# Patient Record
Sex: Male | Born: 1995 | Race: Black or African American | Hispanic: No | Marital: Single | Smoking: Never smoker
Health system: Southern US, Community
[De-identification: ages and names within clinical notes are randomized; demographics above are authoritative.]

## PROBLEM LIST (undated history)

## (undated) DIAGNOSIS — S83207A Unspecified tear of unspecified meniscus, current injury, left knee, initial encounter: Secondary | ICD-10-CM

## (undated) DIAGNOSIS — Z87820 Personal history of traumatic brain injury: Secondary | ICD-10-CM

---
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
## 2016-02-26 ENCOUNTER — Ambulatory Visit (HOSPITAL_COMMUNITY)
Admission: EM | Admit: 2016-02-26 | Discharge: 2016-02-26 | Disposition: A | Discharge status: Home / Self Care | Payer: Managed Care, Other (non HMO) | Attending: Internal Medicine | Admitting: Internal Medicine

## 2016-02-26 ENCOUNTER — Encounter (HOSPITAL_COMMUNITY): Payer: Self-pay | Admitting: *Deleted

## 2016-02-26 DIAGNOSIS — J019 Acute sinusitis, unspecified: Secondary | ICD-10-CM

## 2016-02-26 MED ORDER — TRIAMCINOLONE ACETONIDE 55 MCG/ACT NA AERO: 2.0000 | INHALATION_SPRAY | Freq: Every day | NASAL | 0 refills | Status: DC

## 2016-02-26 MED ORDER — AMOXICILLIN-POT CLAVULANATE 875-125 MG PO TABS: 1.0000 | ORAL_TABLET | Freq: Two times a day (BID) | ORAL | 0 refills | Status: AC

## 2016-02-26 MED ORDER — AMOXICILLIN-POT CLAVULANATE 875-125 MG PO TABS: 1.0000 | ORAL_TABLET | Freq: Two times a day (BID) | ORAL | 0 refills | Status: DC

## 2016-02-26 NOTE — ED Triage Notes (Signed)
Pt  Reports  Symptoms    Of  Body  Aches        wirth  Symptoms   X      10  Days  Pt     Ambulated  To  Room  With a  Steady  Fluid  Gait  Pt         Reports      Symptoms  Not  releived    By otc meds

## 2016-02-26 NOTE — Discharge Instructions (Addendum)
Rest and push fluids.  Prescription for amoxicillin/clavulanate (antibiotic) and nasacort (nasal steroid, for congestion) was sent to the [facility] on [address].  Note for practice today.  Recheck or followup with primary care provider Delorise Jacksonichard Boette for further evaluation if symptoms persist.

## 2016-02-26 NOTE — ED Provider Notes (Signed)
[facility]    CSN: [id number] Arrival date & time: 02/26/16  1134     History   Chief Complaint Chief Complaint  Patient presents with  . URI    HPI Christian Grant is a 20 y.o. male. Presents today with >10d hx of scratchy throat, prod cough, sneezing, severe sinus congestion and runny nose.  Malaise, fatigue.  Headache.  Tactile temp.  Not achey.  No vomiting, no diarrhea.  HPI  Past Medical History:  Diagnosis Date  . Acute meniscal tear of left knee   . History of concussion    2012-- PLAYING FOOTBALL    Patient Active Problem List   Diagnosis Date Noted  . S/P left knee arthroscopy 07/27/2013    Past Surgical History:  Procedure Laterality Date  . KNEE ARTHROSCOPY Left 07/27/2013   Procedure: LEFT ARTHROSCOPY KNEE WITH PARTIAL MENISECTOMY VS REPAIR;  Surgeon: Eugenia Mcalpine, MD;  Location: [facility] [place];  Service: Orthopedics;  Laterality: Left;       Home Medications    Prior to Admission medications   Medication Sig Start Date End Date Taking? Authorizing Provider  amoxicillin-clavulanate (AUGMENTIN) 875-125 MG tablet Take 1 tablet by mouth 2 (two) times daily. 02/26/16 03/07/16  Eustace Moore, MD  triamcinolone (NASACORT AQ) 55 MCG/ACT AERO nasal inhaler Place 2 sprays into the nose daily. 02/26/16   Eustace Moore, MD    Family History History reviewed. No pertinent family history.  Social History Social History  Substance Use Topics  . Smoking status: Never Smoker  . Smokeless tobacco: Not on file     Comment: NO SMOKER IN HOME  . Alcohol use No     Allergies   Vicodin [hydrocodone-acetaminophen] and Vicodin [hydrocodone-acetaminophen]   Review of Systems Review of Systems  All other systems reviewed and are negative.    Physical Exam Triage Vital Signs ED Triage Vitals [02/26/16 1212]  Enc Vitals Group     BP 115/58     Pulse Rate 78     Resp 18     Temp 98.6 F (37 C)     Temp Source Oral     SpO2 100 %       Weight      Height    Updated Vital Signs BP 115/58 (BP Location: Right Arm)   Pulse 78   Temp 98.6 F (37 C) (Oral)   Resp 18   SpO2 100%  Physical Exam  Constitutional: He is oriented to person, place, and time.  Alert, nicely groomed Looks ill but not toxic  HENT:  Head: Atraumatic.  B ears are waxy Marked nasal congestion with mucopurulent material present Throat red with post nasal drainage  Eyes:  Conjugate gaze, no eye redness/drainage  Neck: Neck supple.  Cardiovascular: Normal rate.   Pulmonary/Chest: No respiratory distress.  Lungs clear, symmetric breath sounds  Abdominal: He exhibits no distension.  Musculoskeletal: Normal range of motion.  Neurological: He is alert and oriented to person, place, and time.  Skin: Skin is warm and dry.  No cyanosis  Nursing note and vitals reviewed.    UC Treatments / Results   Procedures Procedures (including critical care time)      None today  Final Clinical Impressions(s) / UC Diagnoses   Final diagnoses:  Acute sinusitis with symptoms > 10 days   Rest and push fluids.  Prescription for amoxicillin/clavulanate (antibiotic) and nasacort (nasal steroid, for congestion) was sent to the [facility] on [address].  Note  for practice today.  Recheck or followup with primary care provider Delorise Jacksonichard Boette for further evaluation if symptoms persist.    New Prescriptions Current Discharge Medication List    START taking these medications   Details  amoxicillin-clavulanate (AUGMENTIN) 875-125 MG tablet Take 1 tablet by mouth 2 (two) times daily. Qty: 20 tablet, Refills: 0    triamcinolone (NASACORT AQ) 55 MCG/ACT AERO nasal inhaler Place 2 sprays into the nose daily. Qty: 1 Inhaler, Refills: 0         Eustace MooreLaura W Caeleb Batalla, MD 02/29/16 [phone]

## 2016-04-16 ENCOUNTER — Emergency Department (HOSPITAL_BASED_OUTPATIENT_CLINIC_OR_DEPARTMENT_OTHER)
Admission: EM | Admit: 2016-04-16 | Discharge: 2016-04-17 | Disposition: A | Discharge status: Home / Self Care | Payer: Managed Care, Other (non HMO) | Attending: Emergency Medicine | Admitting: Emergency Medicine

## 2016-04-16 ENCOUNTER — Encounter (HOSPITAL_BASED_OUTPATIENT_CLINIC_OR_DEPARTMENT_OTHER): Payer: Self-pay

## 2016-04-16 DIAGNOSIS — J029 Acute pharyngitis, unspecified: Secondary | ICD-10-CM | POA: Diagnosis not present

## 2016-04-16 DIAGNOSIS — R509 Fever, unspecified: Secondary | ICD-10-CM | POA: Diagnosis present

## 2016-04-16 LAB — CBC WITH DIFFERENTIAL/PLATELET
Basophils Absolute: 0 10*3/uL (ref 0.0–0.1)
Basophils Relative: 0 %
EOS ABS: 0 10*3/uL (ref 0.0–0.7)
EOS PCT: 0 %
HCT: 39.8 % (ref 39.0–52.0)
Hemoglobin: 13.5 g/dL (ref 13.0–17.0)
LYMPHS ABS: 1.1 10*3/uL (ref 0.7–4.0)
Lymphocytes Relative: 17 %
MCH: 28.7 pg (ref 26.0–34.0)
MCHC: 33.9 g/dL (ref 30.0–36.0)
MCV: 84.7 fL (ref 78.0–100.0)
MONO ABS: 1 10*3/uL (ref 0.1–1.0)
MONOS PCT: 16 %
Neutro Abs: 4.3 10*3/uL (ref 1.7–7.7)
Neutrophils Relative %: 67 %
Platelets: 229 10*3/uL (ref 150–400)
RBC: 4.7 MIL/uL (ref 4.22–5.81)
RDW: 12 % (ref 11.5–15.5)
WBC: 6.4 10*3/uL (ref 4.0–10.5)

## 2016-04-16 LAB — MONONUCLEOSIS SCREEN: Mono Screen: NEGATIVE

## 2016-04-16 LAB — RAPID STREP SCREEN (MED CTR MEBANE ONLY): STREPTOCOCCUS, GROUP A SCREEN (DIRECT): NEGATIVE

## 2016-04-16 MED ORDER — ACETAMINOPHEN 325 MG PO TABS
650.0000 mg | ORAL_TABLET | Freq: Once | ORAL | Status: AC
  Administered 2016-04-16: 650 mg via ORAL
  Filled 2016-04-16: qty 2

## 2016-04-16 MED ORDER — AMOXICILLIN 500 MG PO CAPS: 1000.0000 mg | ORAL_CAPSULE | Freq: Two times a day (BID) | ORAL | 0 refills | Status: AC

## 2016-04-16 MED ORDER — AMOXICILLIN 500 MG PO CAPS
500.0000 mg | ORAL_CAPSULE | Freq: Once | ORAL | Status: AC
  Administered 2016-04-16: 500 mg via ORAL
  Filled 2016-04-16: qty 1

## 2016-04-16 NOTE — ED Notes (Signed)
Pt verbalizes understanding of d/c instructions and denies any further needs at this time. 

## 2016-04-16 NOTE — ED Triage Notes (Signed)
Pt states he can take tylenol without allergy

## 2016-04-16 NOTE — Discharge Instructions (Signed)
You were seen in the ED today with sore throat and fever. We are treating you with antibiotics and will have you follow up with your PCP.   Return to the ED with any sudden worsening sore throat, difficulty speaking, drooling, or difficulty breathing.

## 2016-04-16 NOTE — ED Triage Notes (Signed)
C/o HA x 1 week-fever started yesterday-last dose motrin 400mg  just PTA-NAD-steady gait

## 2016-04-16 NOTE — ED Provider Notes (Signed)
Emergency Department Provider Note   I have reviewed the triage vital signs and the nursing notes.  By signing my name below, I, Christian Grant, attest that this documentation has been prepared under the direction and in the presence of Christian PlanJoshua G Long, MD. Electronically Signed: Bridgette HabermannMaria Grant, ED Scribe. 04/16/16. 9:48 PM.  HISTORY  Chief Complaint Fever  HPI Comments: Christian Grant is a 20 y.o. male with no pertinent PMHx, who presents to the Emergency Department complaining of headache onset one week ago with associated fever (Tmax 103), sore throat, and nasal congestion. He has taken Motrin 400 mg with moderate relief to his symptoms. Pt has h/o migraines and states his headache at this time feels similar. No known sick contacts with similar symptoms. Pt denies vomiting, leg swelling, or any other associated symptoms.   Past Medical History:  Diagnosis Date  . Acute meniscal tear of left knee   . History of concussion    2012-- PLAYING FOOTBALL    Patient Active Problem List   Diagnosis Date Noted  . S/P left knee arthroscopy 07/27/2013    Past Surgical History:  Procedure Laterality Date  . KNEE ARTHROSCOPY Left 07/27/2013   Procedure: LEFT ARTHROSCOPY KNEE WITH PARTIAL MENISECTOMY VS REPAIR;  Surgeon: Christian Mcalpineobert Collins, MD;  [facility] [place];  Service: Orthopedics;  Laterality: Left;      Allergies Vicodin [hydrocodone-acetaminophen] and Vicodin [hydrocodone-acetaminophen]  No family history on file.  Social History Social History  Substance Use Topics  . Smoking status: Never Smoker  . Smokeless tobacco: Never Used  . Alcohol use No    Review of Systems Constitutional: Fever. No chills. Eyes: No visual changes. ENT: Sore throat. Nasal congestion. Cardiovascular: Denies chest pain. Respiratory: Denies shortness of breath. Gastrointestinal: No abdominal pain.  No nausea, no vomiting.  No diarrhea.  No constipation. Genitourinary: Negative for  dysuria. Musculoskeletal: Negative for back pain. Skin: Negative for rash. Neurological: Headache. No focal weakness or numbness. 10-point ROS otherwise negative.  ____________________________________________   PHYSICAL EXAM:  VITAL SIGNS: ED Triage Vitals  Enc Vitals Group     BP 04/16/16 2100 132/76     Pulse Rate 04/16/16 2100 86     Resp 04/16/16 2100 18     Temp 04/16/16 2100 102.6 F (39.2 C)     Temp Source 04/16/16 2100 Oral     SpO2 04/16/16 2100 95 %     Weight 04/16/16 2101 178 lb (80.7 kg)     Height 04/16/16 2101 6\' 2"  (1.88 m)     Pain Score 04/16/16 2057 8   Constitutional: Alert and oriented. Well appearing and in no acute distress. Eyes: Conjunctivae are normal.  Head: Atraumatic. Nose: No congestion/rhinnorhea. Mouth/Throat: Mucous membranes are moist.  Bilateral tonsillar hypertrophy and tonsillar exudate. No PTA. Managing oral secretions. Positive cervical adenopathy.   Neck: No stridor.  No meningeal signs.   Cardiovascular: Normal rate, regular rhythm. Good peripheral circulation. Grossly normal heart sounds.   Respiratory: Normal respiratory effort.  No retractions. Lungs CTAB. Gastrointestinal: Soft and nontender. No distention.  Musculoskeletal: No lower extremity tenderness nor edema. No gross deformities of extremities. Neurologic:  Normal speech and language. No gross focal neurologic deficits are appreciated.  Skin:  Skin is warm, dry and intact. No rash noted.   ____________________________________________   LABS (all labs ordered are listed, but only abnormal results are displayed)  Labs Reviewed  RAPID STREP SCREEN (NOT [facility])  CULTURE, GROUP A STREP [facility])  MONONUCLEOSIS  SCREEN  CBC WITH DIFFERENTIAL/PLATELET   ____________________________________________   PROCEDURES  Procedure(s) performed:   Procedures  None ____________________________________________   INITIAL IMPRESSION / ASSESSMENT AND PLAN / ED  COURSE  Pertinent labs & imaging results that were available during my care of the patient were reviewed by me and considered in my medical decision making (see chart for details).  Patient presents to the ED with sore throat and fever. He has tonsillar hypertrophy and exudate with cervical adenopathy. No cough. High risk for strep by [facility] criteria despite negative rapid strep. Mono screen and CBC negative. 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 With negative mono screen he will take abx for likely strep and follow up with PCP and trainer regarding return to play.  At this time, I do not feel there is any life-threatening condition present. I have reviewed and discussed all results (EKG, imaging, lab, urine as appropriate), exam findings with patient. I have reviewed nursing notes and appropriate previous records.  I feel the patient is safe to be discharged home without further emergent workup. Discussed usual and customary return precautions. Patient and family (if present) verbalize understanding and are comfortable with this plan.  Patient will follow-up with their primary care provider. If they do not have a primary care provider, information for follow-up has been provided to them. All questions have been answered.  ____________________________________________  FINAL CLINICAL IMPRESSION(S) / ED DIAGNOSES  Final diagnoses:  Pharyngitis, unspecified etiology     MEDICATIONS GIVEN DURING THIS VISIT:  Medications  acetaminophen (TYLENOL) tablet 650 mg (650 mg Oral Given 04/16/16 2102)  amoxicillin (AMOXIL) capsule 500 mg (500 mg Oral Given 04/16/16 2350)     NEW OUTPATIENT MEDICATIONS STARTED DURING THIS VISIT:  Discharge Medication List as of 04/16/2016 11:39 PM    START taking these medications   Details  amoxicillin (AMOXIL) 500 MG capsule Take 2 capsules (1,000 mg total) by mouth 2 (two) times daily., Starting Tue 04/16/2016, Until Tue 04/23/2016, Print       I personally  performed the services described in this documentation, which was scribed in my presence. The recorded information has been reviewed and is accurate.   Note:  This document was prepared using Dragon voice recognition software and may include unintentional dictation errors.  Alona BeneJoshua Long, MD Emergency Medicine    Christian PlanJoshua G Long, MD 04/17/16 [phone]

## 2016-04-19 LAB — CULTURE, GROUP A STREP (THRC)

## 2016-09-13 ENCOUNTER — Emergency Department (HOSPITAL_BASED_OUTPATIENT_CLINIC_OR_DEPARTMENT_OTHER)
Admission: EM | Admit: 2016-09-13 | Discharge: 2016-09-13 | Disposition: A | Discharge status: Home / Self Care | Payer: 59 | Attending: Emergency Medicine | Admitting: Emergency Medicine

## 2016-09-13 ENCOUNTER — Encounter (HOSPITAL_BASED_OUTPATIENT_CLINIC_OR_DEPARTMENT_OTHER): Payer: Self-pay | Admitting: Emergency Medicine

## 2016-09-13 DIAGNOSIS — J029 Acute pharyngitis, unspecified: Secondary | ICD-10-CM

## 2016-09-13 DIAGNOSIS — J069 Acute upper respiratory infection, unspecified: Secondary | ICD-10-CM | POA: Diagnosis not present

## 2016-09-13 DIAGNOSIS — R509 Fever, unspecified: Secondary | ICD-10-CM | POA: Diagnosis present

## 2016-09-13 LAB — RAPID STREP SCREEN (MED CTR MEBANE ONLY): Streptococcus, Group A Screen (Direct): NEGATIVE

## 2016-09-13 MED ORDER — IBUPROFEN 400 MG PO TABS
400.0000 mg | ORAL_TABLET | Freq: Once | ORAL | Status: AC
  Administered 2016-09-13: 400 mg via ORAL
  Filled 2016-09-13: qty 1

## 2016-09-13 NOTE — ED Provider Notes (Signed)
[facility]   CSN: [id number] Arrival date & time: 09/13/16  1201     History   Chief Complaint Chief Complaint  Patient presents with  . Fever  . Sore Throat    HPI Christian Grant is a 21 y.o. male.  Patient c/o sore throat for the past 3 days. Symptoms persistent, constant, moderate. No trouble breathing or swallowing. +nasal congestion, +occasional non prod cough. No sore throat. +fever. No cp or sob. No known mono or strep exposure.    The history is provided by the patient.  Fever   Associated symptoms include sore throat. Pertinent negatives include no diarrhea, no vomiting and no headaches.  Sore Throat  Pertinent negatives include no headaches and no shortness of breath.    Past Medical History:  Diagnosis Date  . Acute meniscal tear of left knee   . History of concussion    2012-- PLAYING FOOTBALL    Patient Active Problem List   Diagnosis Date Noted  . S/P left knee arthroscopy 07/27/2013    Past Surgical History:  Procedure Laterality Date  . KNEE ARTHROSCOPY Left 07/27/2013   Procedure: LEFT ARTHROSCOPY KNEE WITH PARTIAL MENISECTOMY VS REPAIR;  Surgeon: Eugenia Mcalpine, MD;  Location: [facility] [place];  Service: Orthopedics;  Laterality: Left;       Home Medications    Prior to Admission medications   Not on File    Family History History reviewed. No pertinent family history.  Social History Social History  Substance Use Topics  . Smoking status: Never Smoker  . Smokeless tobacco: Never Used  . Alcohol use No     Allergies   Vicodin [hydrocodone-acetaminophen] and Vicodin [hydrocodone-acetaminophen]   Review of Systems Review of Systems  Constitutional: Positive for fever.  HENT: Positive for sore throat. Negative for trouble swallowing and voice change.   Respiratory: Negative for shortness of breath.   Gastrointestinal: Negative for diarrhea and vomiting.  Musculoskeletal: Negative for neck  pain and neck stiffness.  Skin: Negative for rash.  Neurological: Negative for headaches.     Physical Exam Updated Vital Signs BP 122/79   Pulse 74   Temp 99.5 F (37.5 C) (Oral)   Resp 18   Ht  (1.854 m)   Wt 81.6 kg   SpO2 98%   BMI 23.75 kg/m   Physical Exam  Constitutional: He appears well-developed and well-nourished. No distress.  HENT:  Pharynx erythematous, no asymmetric swelling or abscess. No trismus.   Eyes: Conjunctivae are normal.  Neck: Neck supple. No tracheal deviation present.  No stiffness or rigidity  Cardiovascular: Normal rate, regular rhythm, normal heart sounds and intact distal pulses.  Exam reveals no gallop and no friction rub.   No murmur heard. Pulmonary/Chest: Effort normal and breath sounds normal. No accessory muscle usage. No respiratory distress.  Abdominal: He exhibits no distension. There is no tenderness.  No hsm.   Musculoskeletal: He exhibits no edema.  Lymphadenopathy:    He has no cervical adenopathy.  Neurological: He is alert.  Skin: Skin is warm and dry. No rash noted. He is not diaphoretic.  Psychiatric: He has a normal mood and affect.  Nursing note and vitals reviewed.    ED Treatments / Results  Labs (all labs ordered are listed, but only abnormal results are displayed) Results for orders placed or performed during the hospital encounter of 09/13/16  Rapid strep screen  Result Value Ref Range   Streptococcus, Group A Screen (Direct)  NEGATIVE NEGATIVE    EKG  EKG Interpretation None       Radiology No results found.  Procedures Procedures (including critical care time)  Medications Ordered in ED Medications  ibuprofen (ADVIL,MOTRIN) tablet 400 mg (not administered)     Initial Impression / Assessment and Plan / ED Course  I have reviewed the triage vital signs and the nursing notes.  Pertinent labs & imaging results that were available during my care of the patient were reviewed by me and  considered in my medical decision making (see chart for details).  Strep test neg.  Patient denies taking any meds today.  Motrin po.  Po fluids.  Symptoms felt most c/w viral pharyngitis/uri.     Final Clinical Impressions(s) / ED Diagnoses   Final diagnoses:  None    New Prescriptions New Prescriptions   No medications on file     Cathren Laine, MD 09/13/16 1252

## 2016-09-13 NOTE — Discharge Instructions (Signed)
It was our pleasure to provide your ER care today - we hope that you feel better.  Your strep test is normal or negative.  Your symptoms are most likely the result of a viral cold or flu-like illness which should run its course and get better in the next 3-4 days.   Rest. Drink adequate fluids.  Take acetaminophen or ibuprofen as need for fever and pain.  Use throat lozenges as need.  Follow up with primary care doctor in 1 week if symptoms fail to improve/resolve.  Return to ER if worse, unable to swallow, trouble breathing, other concern.

## 2016-09-13 NOTE — ED Triage Notes (Signed)
Fever, sore throat and eye pain x 3 days

## 2016-09-15 LAB — CULTURE, GROUP A STREP (THRC)
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
## 2019-01-02 ENCOUNTER — Other Ambulatory Visit: Payer: Self-pay

## 2019-01-02 DIAGNOSIS — Z20822 Contact with and (suspected) exposure to covid-19: Secondary | ICD-10-CM

## 2019-01-03 LAB — NOVEL CORONAVIRUS, NAA: SARS-CoV-2, NAA: NOT DETECTED

## 2019-05-28 ENCOUNTER — Ambulatory Visit
Admission: EM | Admit: 2019-05-28 | Discharge: 2019-05-28 | Disposition: A | Discharge status: Home / Self Care | Payer: PRIVATE HEALTH INSURANCE | Attending: Physician Assistant | Admitting: Physician Assistant

## 2019-05-28 ENCOUNTER — Other Ambulatory Visit: Payer: Self-pay

## 2019-05-28 ENCOUNTER — Encounter: Payer: Self-pay | Admitting: Physician Assistant

## 2019-05-28 DIAGNOSIS — R300 Dysuria: Secondary | ICD-10-CM | POA: Diagnosis not present

## 2019-05-28 LAB — POCT URINALYSIS DIP (MANUAL ENTRY)
Bilirubin, UA: NEGATIVE
Blood, UA: NEGATIVE
Glucose, UA: NEGATIVE mg/dL
Ketones, POC UA: NEGATIVE mg/dL
Leukocytes, UA: NEGATIVE
Nitrite, UA: NEGATIVE
Protein Ur, POC: NEGATIVE mg/dL
Spec Grav, UA: >=1.03 — AB (ref 1.010–1.025)
Urobilinogen, UA: 0.2 E.U./dL
pH, UA: 6 (ref 5.0–8.0)

## 2019-05-28 NOTE — ED Provider Notes (Signed)
[facility]    CSN: [id number] Arrival date & time: 05/28/19  1843      History   Chief Complaint No chief complaint on file.   HPI Christian Grant is a 24 y.o. male.   24 year old male comes in for 1 to 2-week history of dysuria.  Had STD testing 4 days ago with negative results, including GC, trichomoniasis, HIV, RPR.  States has irritation/tingling sensation to the tip of the penis.  Denies urinary frequency, hematuria.  Denies abdominal pain, nausea, vomiting.  Denies fever, chills, body aches.  Denies testicular swelling, testicular pain, penile discharge, penile lesions.  Has not had sexual activity since STD testing.  Denies erythema/swelling to the tip of the penis.  Occasional alcohol use, no caffeine use.      Past Medical History:  Diagnosis Date  . Acute meniscal tear of left knee   . History of concussion    2012-- PLAYING FOOTBALL    Patient Active Problem List   Diagnosis Date Noted  . S/P left knee arthroscopy 07/27/2013    Past Surgical History:  Procedure Laterality Date  . KNEE ARTHROSCOPY Left 07/27/2013   Procedure: LEFT ARTHROSCOPY KNEE WITH PARTIAL MENISECTOMY VS REPAIR;  Surgeon: Sydnee Cabal, MD;  Location: [facility];  Service: Orthopedics;  Laterality: Left;       Home Medications    Prior to Admission medications   Not on File    Family History History reviewed. No pertinent family history.  Social History Social History   Tobacco Use  . Smoking status: Never Smoker  . Smokeless tobacco: Never Used  Substance Use Topics  . Alcohol use: No  . Drug use: No     Allergies   Vicodin [hydrocodone-acetaminophen] and Vicodin [hydrocodone-acetaminophen]   Review of Systems Review of Systems  Reason unable to perform ROS: See HPI as above.     Physical Exam Triage Vital Signs ED Triage Vitals [05/28/19 1855]  Enc Vitals Group     BP 130/76     Pulse Rate (!) 48     Resp 16     Temp 98.1 F (36.7  C)     Temp Source Oral     SpO2 97 %     Weight      Height      Head Circumference      Peak Flow      Pain Score      Pain Loc      Pain Edu?      Excl. in [place]?    No data found.  Updated Vital Signs BP 130/76 (BP Location: Left Arm)   Pulse (!) 48   Temp 98.1 F (36.7 C) (Oral)   Resp 16   SpO2 97%   Physical Exam Exam conducted with a chaperone present.  Constitutional:      General: He is not in acute distress.    Appearance: He is well-developed. He is not diaphoretic.  HENT:     Head: Normocephalic and atraumatic.  Eyes:     Conjunctiva/sclera: Conjunctivae normal.     Pupils: Pupils are equal, round, and reactive to light.  Pulmonary:     Effort: Pulmonary effort is normal. No respiratory distress.  Genitourinary:    Penis: Circumcised. No erythema, tenderness, discharge or swelling.      Testes: Normal.     Epididymis:     Right: Normal.     Left: Normal.  Skin:    General: Skin  is warm and dry.  Neurological:     Mental Status: He is alert and oriented to person, place, and time.      UC Treatments / Results  Labs (all labs ordered are listed, but only abnormal results are displayed) Labs Reviewed  POCT URINALYSIS DIP (MANUAL ENTRY) - Abnormal; Notable for the following components:      Result Value   Spec Grav, UA >=1.030 (*)    All other components within normal limits    EKG   Radiology No results found.  Procedures Procedures (including critical care time)  Medications Ordered in UC Medications - No data to display  Initial Impression / Assessment and Plan / UC Course  I have reviewed the triage vital signs and the nursing notes.  Pertinent labs & imaging results that were available during my care of the patient were reviewed by me and considered in my medical decision making (see chart for details).    Normal exam.  Urine shows increase in specific gravity, negative leukocytes/nitrites.  Negative blood, low suspicion for  urethral stone causing symptoms.  Discussed to push fluids at this time. Return precautions given. Otherwise, follow-up with urology if symptoms not improving.   Patient expresses understanding and agrees to plan.   Final Clinical Impressions(s) / UC Diagnoses   Final diagnoses:  Dysuria   ED Prescriptions    None     PDMP not reviewed this encounter.   Belinda Fisher, PA-C 05/28/19 1912

## 2019-05-28 NOTE — Discharge Instructions (Addendum)
Urine negative for infection. Does show you are dehydrated, which can cause some of your symptoms. Keep hydrated, urine should be clear to pale yellow in color. If developing testicular swelling/pain, penile lesion/sore, follow up for reevaluation. Otherwise follow up with urology if symptoms not improving.

## 2019-05-28 NOTE — ED Triage Notes (Signed)
Pt c/o urinary frequency and tingling for over a week and a half.

## 2019-06-04 ENCOUNTER — Encounter (HOSPITAL_BASED_OUTPATIENT_CLINIC_OR_DEPARTMENT_OTHER): Payer: Self-pay

## 2019-06-04 ENCOUNTER — Emergency Department (HOSPITAL_BASED_OUTPATIENT_CLINIC_OR_DEPARTMENT_OTHER)
Admission: EM | Admit: 2019-06-04 | Discharge: 2019-06-04 | Disposition: A | Discharge status: Home / Self Care | Payer: PRIVATE HEALTH INSURANCE | Attending: Emergency Medicine | Admitting: Emergency Medicine

## 2019-06-04 ENCOUNTER — Other Ambulatory Visit: Payer: Self-pay

## 2019-06-04 DIAGNOSIS — N489 Disorder of penis, unspecified: Secondary | ICD-10-CM | POA: Insufficient documentation

## 2019-06-04 DIAGNOSIS — Z885 Allergy status to narcotic agent status: Secondary | ICD-10-CM | POA: Insufficient documentation

## 2019-06-04 DIAGNOSIS — Z202 Contact with and (suspected) exposure to infections with a predominantly sexual mode of transmission: Secondary | ICD-10-CM | POA: Insufficient documentation

## 2019-06-04 DIAGNOSIS — Z7689 Persons encountering health services in other specified circumstances: Secondary | ICD-10-CM

## 2019-06-04 LAB — URINALYSIS, ROUTINE W REFLEX MICROSCOPIC
Bilirubin Urine: NEGATIVE
Glucose, UA: NEGATIVE mg/dL
Hgb urine dipstick: NEGATIVE
Ketones, ur: NEGATIVE mg/dL
Leukocytes,Ua: NEGATIVE
Nitrite: NEGATIVE
Protein, ur: NEGATIVE mg/dL
Specific Gravity, Urine: 1.025 (ref 1.005–1.030)
pH: 6 (ref 5.0–8.0)

## 2019-06-04 LAB — HIV ANTIBODY (ROUTINE TESTING W REFLEX): HIV Screen 4th Generation wRfx: NONREACTIVE

## 2019-06-04 MED ORDER — LIDOCAINE HCL (PF) 1 % IJ SOLN
INTRAMUSCULAR | Status: AC
  Administered 2019-06-04: 2 mL via INTRAMUSCULAR
  Filled 2019-06-04: qty 5

## 2019-06-04 MED ORDER — METRONIDAZOLE 500 MG PO TABS
2000.0000 mg | ORAL_TABLET | Freq: Once | ORAL | Status: AC
  Administered 2019-06-04: 2000 mg via ORAL
  Filled 2019-06-04: qty 4

## 2019-06-04 MED ORDER — DOXYCYCLINE HYCLATE 100 MG PO CAPS: 100.0000 mg | ORAL_CAPSULE | Freq: Two times a day (BID) | ORAL | 0 refills | Status: AC

## 2019-06-04 MED ORDER — CEFTRIAXONE SODIUM 500 MG IJ SOLR
500.0000 mg | Freq: Once | INTRAMUSCULAR | Status: AC
  Administered 2019-06-04: 500 mg via INTRAMUSCULAR
  Filled 2019-06-04: qty 500

## 2019-06-04 NOTE — Discharge Instructions (Addendum)
You were given a prescription for antibiotics. Please take the antibiotic prescription fully.   You have been tested for HIV, syphilis, chlamydia and gonorrhea.  These results will be available in approximately 3 days and you will be contacted by the hospital if the results are positive. Avoid sexual contact until you are aware of the results, and please inform all sexual partners if you test positive for any of these diseases.  Follow up with urology in 5-7 days. Please return to the emergency department for any new or worsening symptoms.

## 2019-06-04 NOTE — ED Provider Notes (Signed)
[facility] EMERGENCY DEPARTMENT Provider Note   CSN: [id number] Arrival date & time: 06/04/19  1140     History No chief complaint on file.   Christian Grant is a 24 y.o. male.  HPI   24 year old male presenting for evaluation due to concern for possible STD.  States that for the last 3 weeks he has had a tingling sensation to the tip of his penis.  He was tested for STDs about 3 weeks ago and results were negative.  He continued to have the abnormal sensation therefore followed up at urgent care last week for assessment for possible UTI however urinalysis was negative for this.  He has continued to have symptoms and now is also concerned because he thinks he has some redness at the meatus.  He denies any overt lesions, discharge, dysuria or urgency.  He states he was told he was dehydrated at urgent care so he has been hydrating and that therefore has had some urinary frequency.  Denies any fevers abdominal pain or GI symptoms.  He does report that he has had unprotected intercourse recently.  Past Medical History:  Diagnosis Date  . Acute meniscal tear of left knee   . History of concussion    2012-- PLAYING FOOTBALL    Patient Active Problem List   Diagnosis Date Noted  . S/P left knee arthroscopy 07/27/2013    Past Surgical History:  Procedure Laterality Date  . KNEE ARTHROSCOPY Left 07/27/2013   Procedure: LEFT ARTHROSCOPY KNEE WITH PARTIAL MENISECTOMY VS REPAIR;  Surgeon: Eugenia Mcalpine, MD;  Location: [facility];  Service: Orthopedics;  Laterality: Left;       No family history on file.  Social History   Tobacco Use  . Smoking status: Never Smoker  . Smokeless tobacco: Never Used  Substance Use Topics  . Alcohol use: Yes    Comment: occ  . Drug use: No    Home Medications Prior to Admission medications   Medication Sig Start Date End Date Taking? Authorizing Provider  doxycycline (VIBRAMYCIN) 100 MG capsule Take 1 capsule (100 mg  total) by mouth 2 (two) times daily for 7 days. 06/04/19 06/11/19  Deon Duer S, PA-C    Allergies    Vicodin [hydrocodone-acetaminophen]  Review of Systems   Review of Systems  Constitutional: Negative for fever.  Respiratory: Negative for shortness of breath.   Cardiovascular: Negative for chest pain.  Gastrointestinal: Negative for abdominal pain, constipation, diarrhea, nausea and vomiting.  Genitourinary: Positive for frequency. Negative for difficulty urinating, discharge, dysuria, genital sores, hematuria, penile pain, penile swelling, scrotal swelling, testicular pain and urgency.       Tingling sensation to penis, erythema to penis  Musculoskeletal: Negative for back pain.    Physical Exam Updated Vital Signs BP (!) 146/93 (BP Location: Left Arm)   Pulse 75   Temp 98.6 F (37 C) (Oral)   Resp 16   Ht 6\' 2"  (1.88 m)   Wt 82.1 kg   SpO2 98%   BMI 23.24 kg/m   Physical Exam Constitutional:      General: He is not in acute distress.    Appearance: He is well-developed.  Eyes:     Conjunctiva/sclera: Conjunctivae normal.  Cardiovascular:     Rate and Rhythm: Normal rate and regular rhythm.  Pulmonary:     Effort: Pulmonary effort is normal.     Breath sounds: Normal breath sounds.  Genitourinary:    Comments: Chaperone presents. uncircumcised penis. No  erythema, lesions, or abnormalities noted to the skin, specifically the meatus appears wnl. No discharge noted. Skin:    General: Skin is warm and dry.  Neurological:     Mental Status: He is alert and oriented to person, place, and time.     ED Results / Procedures / Treatments   Labs (all labs ordered are listed, but only abnormal results are displayed) Labs Reviewed  URINALYSIS, ROUTINE W REFLEX MICROSCOPIC  HIV ANTIBODY (ROUTINE TESTING W REFLEX)  RPR  GC/CHLAMYDIA PROBE AMP ([place]) NOT AT [facility]    EKG None  Radiology No results found.  Procedures Procedures (including critical care  time)  Medications Ordered in ED Medications  cefTRIAXone (ROCEPHIN) injection 500 mg (has no administration in time range)  metroNIDAZOLE (FLAGYL) tablet 2,000 mg (has no administration in time range)    ED Course  I have reviewed the triage vital signs and the nursing notes.  Pertinent labs & imaging results that were available during my care of the patient were reviewed by me and considered in my medical decision making (see chart for details).    MDM Rules/Calculators/A&P                      24 year old male presenting for evaluation due to concern for possible STD.  States that for the last 3 weeks he has had a tingling sensation to the tip of his penis.  He was tested for STDs about 3 weeks ago and results were negative.  He continued to have the abnormal sensation therefore followed up at urgent care last week for assessment for possible UTI however urinalysis was negative for this.  He has continued to have symptoms and now is also concerned because he thinks he has some redness at the meatus.  He denies any overt lesions, discharge, dysuria or urgency.  He states he was told he was dehydrated at urgent care so he has been hydrating and that therefore has had some urinary frequency.  Denies any fevers abdominal pain or GI symptoms.  He does report that he has had unprotected intercourse recently.  GU examination is within normal limits.  No lesions to suggest herpes.  Urinalysis does not show evidence of UTI.  GC/Chlamydia testing was sent and pending at the time of discharge.  Given patient symptoms have persisted will empirically treat for STDs.  HIV/RPR testing obtained as well.  Will give follow-up with [facility].  Advised on specific return precautions.  He voiced understanding is agreement plan. Pt stable for discharge.  Final Clinical Impression(s) / ED Diagnoses Final diagnoses:  Disorder of penis, unspecified  Encounter for assessment of STD exposure    Rx / DC  Orders ED Discharge Orders         Ordered    doxycycline (VIBRAMYCIN) 100 MG capsule  2 times daily     06/04/19 [id number], [place], PA-C 06/04/19 1243    Virgel Manifold, MD 06/04/19 1247

## 2019-06-04 NOTE — ED Triage Notes (Signed)
Pt requesting STD check-states he was seen for STD check 3 weeks ago and UTI 1 week-no abx given-pt denies penile d/c or dysuria-states he does have redness to penis x 1 wee-NAD-steady gait

## 2019-06-05 LAB — RPR: RPR Ser Ql: NONREACTIVE

## 2019-06-07 LAB — GC/CHLAMYDIA PROBE AMP (~~LOC~~) NOT AT ARMC
Chlamydia: NEGATIVE
Neisseria Gonorrhea: NEGATIVE

## 2019-09-03 ENCOUNTER — Encounter (HOSPITAL_COMMUNITY): Payer: Self-pay | Admitting: Emergency Medicine

## 2019-09-03 ENCOUNTER — Other Ambulatory Visit: Payer: Self-pay

## 2019-09-03 ENCOUNTER — Emergency Department (HOSPITAL_COMMUNITY)
Admission: EM | Admit: 2019-09-03 | Discharge: 2019-09-03 | Disposition: A | Discharge status: Home / Self Care | Payer: PRIVATE HEALTH INSURANCE | Attending: Emergency Medicine | Admitting: Emergency Medicine

## 2019-09-03 DIAGNOSIS — U071 COVID-19: Secondary | ICD-10-CM | POA: Insufficient documentation

## 2019-09-03 DIAGNOSIS — R42 Dizziness and giddiness: Secondary | ICD-10-CM

## 2019-09-03 LAB — CBG MONITORING, ED: Glucose-Capillary: 79 mg/dL (ref 70–99)

## 2019-09-03 NOTE — ED Provider Notes (Signed)
[facility]-EMERGENCY DEPT Provider Note   CSN: [id number] Arrival date & time: 09/03/19  1623     History Chief Complaint  Patient presents with  . covid pos    Christian Grant is a 24 y.o. male.  HPI    24 year old male presenting for evaluation of an episode of lightheadedness.  States that he got up from his bed and walk to the bathroom.  He when he came back to his room after going to the bathroom he felt lightheaded.  He did not actually have a syncopal episode.  He denies residual his symptoms.  He has had a headache since being diagnosed with Covid earlier this week. He has had fevers, cough, body aches.  He denies any chest pain or shortness of breath.  At this time his lightheadedness has resolved.  Past Medical History:  Diagnosis Date  . Acute meniscal tear of left knee   . History of concussion    2012-- PLAYING FOOTBALL    Patient Active Problem List   Diagnosis Date Noted  . S/P left knee arthroscopy 07/27/2013    Past Surgical History:  Procedure Laterality Date  . KNEE ARTHROSCOPY Left 07/27/2013   Procedure: LEFT ARTHROSCOPY KNEE WITH PARTIAL MENISECTOMY VS REPAIR;  Surgeon: Eugenia Mcalpine, MD;  Location: [facility];  Service: Orthopedics;  Laterality: Left;       No family history on file.  Social History   Tobacco Use  . Smoking status: Never Smoker  . Smokeless tobacco: Never Used  Substance Use Topics  . Alcohol use: Yes    Comment: occ  . Drug use: No    Home Medications Prior to Admission medications   Not on File    Allergies    Vicodin [hydrocodone-acetaminophen]  Review of Systems   Review of Systems  Constitutional: Positive for fever.  Respiratory: Positive for cough. Negative for shortness of breath.   Cardiovascular: Negative for chest pain.  Gastrointestinal: Negative for abdominal pain and diarrhea.  Genitourinary: Negative for dysuria.  Musculoskeletal: Positive for myalgias.    Neurological: Positive for light-headedness (resolved) and headaches.    Physical Exam Updated Vital Signs BP (!) 126/93   Pulse (!) 59   Temp 99.2 F (37.3 C) (Oral)   Resp 16   SpO2 99%   Physical Exam Constitutional:      General: He is not in acute distress.    Appearance: He is well-developed.  Eyes:     Conjunctiva/sclera: Conjunctivae normal.  Cardiovascular:     Rate and Rhythm: Normal rate and regular rhythm.  Pulmonary:     Effort: Pulmonary effort is normal.     Breath sounds: Normal breath sounds.  Abdominal:     Palpations: Abdomen is soft.     Tenderness: There is no abdominal tenderness.  Skin:    General: Skin is warm and dry.  Neurological:     Mental Status: He is alert and oriented to person, place, and time.     Comments: Mental Status:  Alert, thought content appropriate, able to give a coherent history. Speech fluent without evidence of aphasia. Able to follow 2 step commands without difficulty.  Cranial Nerves:  II: pupils equal, round, reactive to light III,IV, VI: ptosis not present, extra-ocular motions intact bilaterally  V,VII: smile symmetric, facial light touch sensation equal VIII: hearing grossly normal to voice  X: uvula elevates symmetrically  XI: bilateral shoulder shrug symmetric and strong XII: midline tongue extension without fassiculations Motor:  Normal tone. 5/5 strength of BUE and BLE major muscle groups including strong and equal grip strength and dorsiflexion/plantar flexion Sensory: light touch normal in all extremities. Gait: normal gait and balance.       ED Results / Procedures / Treatments   Labs (all labs ordered are listed, but only abnormal results are displayed) Labs Reviewed  CBG MONITORING, ED    EKG None  Radiology No results found.  Procedures Procedures (including critical care time)  Medications Ordered in ED Medications - No data to display  ED Course  I have reviewed the triage vital  signs and the nursing notes.  Pertinent labs & imaging results that were available during my care of the patient were reviewed by me and considered in my medical decision making (see chart for details).    MDM Rules/Calculators/A&P                     24 year old male presenting for evaluation of an episode of lightheadedness.  States that he got up from his bed and walk to the bathroom.  He when he came back to his room after going to the bathroom he felt lightheaded.  He did not actually have a syncopal episode.  He denies residual his symptoms.  He has had a headache since being diagnosed with Covid earlier this week. He has had fevers, cough, body aches.  He denies any chest pain or shortness of breath.  At this time his lightheadedness has resolved.  Patient's orthostatics are negative.  His CBG is within normal limits.  His EKG was personally reviewed by myself and Dr. Rogene Houston, supervising physician, Which showed normal sinus rhythm, heart rate 51. Also showed w possible ST elevations but this is not diffuse and I have very low suspicion for pericarditis.  I suspect this may be secondary to early repole.  He does not have any chest pain or shortness of breath to suggest pericarditis.  I discussed findings and plan with patient.  Advised to use Tylenol and Motrin for fevers and body aches.  Advised to stay well-hydrated.  He voices understanding of the plan and reasons to return.  All questions answered.  Patient able for discharge.   Christian Grant was evaluated in Emergency Department on 09/03/2019 for the symptoms described in the history of present illness. He was evaluated in the context of the global COVID-19 pandemic, which necessitated consideration that the patient might be at risk for infection with the SARS-CoV-2 virus that causes COVID-19. Institutional protocols and algorithms that pertain to the evaluation of patients at risk for COVID-19 are in a state of rapid change based on  information released by regulatory bodies including the CDC and federal and state organizations. These policies and algorithms were followed during the patient's care in the ED.   Final Clinical Impression(s) / ED Diagnoses Final diagnoses:  COVID-19  Episodic lightheadedness    Rx / DC Orders ED Discharge Orders    None       Bishop Dublin 09/03/19 1936    Fredia Sorrow, MD 09/13/19 [phone]

## 2019-09-03 NOTE — ED Notes (Signed)
Ginger Ale provided for pt as a fluid challenge

## 2019-09-03 NOTE — ED Triage Notes (Signed)
Per pt, states he was diagnosed with Covid on Tuesday-states he sat up in bed this am and felt a little light headed-symptoms resolved

## 2019-09-03 NOTE — Discharge Instructions (Signed)
Stay well hydrated. Rotate tylenol and ibuprofen for body ache and fevers.  Please follow up with your primary care provider within 5-7 days for re-evaluation of your symptoms. If you do not have a primary care provider, information for a healthcare clinic has been provided for you to make arrangements for follow up care. Please return to the emergency department for any new or worsening symptoms.

## 2019-09-21 ENCOUNTER — Other Ambulatory Visit: Payer: Self-pay | Admitting: *Deleted

## 2019-09-21 DIAGNOSIS — U071 COVID-19: Secondary | ICD-10-CM

## 2019-09-24 ENCOUNTER — Other Ambulatory Visit: Payer: Self-pay | Admitting: *Deleted

## 2019-09-24 ENCOUNTER — Ambulatory Visit (HOSPITAL_COMMUNITY): Payer: PRIVATE HEALTH INSURANCE | Attending: Cardiology

## 2019-09-24 ENCOUNTER — Other Ambulatory Visit: Payer: Self-pay

## 2019-09-24 DIAGNOSIS — U071 COVID-19: Secondary | ICD-10-CM | POA: Insufficient documentation

## 2019-09-24 LAB — TROPONIN I (HIGH SENSITIVITY): Troponin I (High Sensitivity): 4 ng/L (ref <18)

## 2020-02-28 DIAGNOSIS — M25562 Pain in left knee: Secondary | ICD-10-CM | POA: Insufficient documentation

## 2020-02-28 DIAGNOSIS — M949 Disorder of cartilage, unspecified: Secondary | ICD-10-CM | POA: Insufficient documentation

## 2022-03-19 ENCOUNTER — Emergency Department (HOSPITAL_BASED_OUTPATIENT_CLINIC_OR_DEPARTMENT_OTHER)
Admission: EM | Admit: 2022-03-19 | Discharge: 2022-03-19 | Disposition: A | Discharge status: Home / Self Care | Payer: BC Managed Care – PPO | Attending: Emergency Medicine | Admitting: Emergency Medicine

## 2022-03-19 ENCOUNTER — Other Ambulatory Visit: Payer: Self-pay

## 2022-03-19 ENCOUNTER — Emergency Department (HOSPITAL_BASED_OUTPATIENT_CLINIC_OR_DEPARTMENT_OTHER): Payer: BC Managed Care – PPO

## 2022-03-19 ENCOUNTER — Encounter (HOSPITAL_BASED_OUTPATIENT_CLINIC_OR_DEPARTMENT_OTHER): Payer: Self-pay | Admitting: Emergency Medicine

## 2022-03-19 DIAGNOSIS — R519 Headache, unspecified: Secondary | ICD-10-CM | POA: Insufficient documentation

## 2022-03-19 DIAGNOSIS — R202 Paresthesia of skin: Secondary | ICD-10-CM | POA: Diagnosis not present

## 2022-03-19 MED ORDER — METOCLOPRAMIDE HCL 10 MG PO TABS
10.0000 mg | ORAL_TABLET | Freq: Once | ORAL | Status: AC
  Administered 2022-03-19: 10 mg via ORAL
  Filled 2022-03-19: qty 1

## 2022-03-19 MED ORDER — METOCLOPRAMIDE HCL 10 MG PO TABS: 10.0000 mg | ORAL_TABLET | Freq: Four times a day (QID) | ORAL | 0 refills | Status: AC

## 2022-03-19 NOTE — ED Provider Notes (Signed)
[facility] EMERGENCY DEPARTMENT Provider Note   CSN: [id number] Arrival date & time: 03/19/22  1208     History  Chief Complaint  Patient presents with   Headache    Christian Grant is a 26 y.o. male.   Headache Patient is a 26 year old male who denies any pertinent past medical history denies any recreational drug or any alcohol use  He is presented emergency room today with complaints of headache for the past 3 days most of the day but states that it is significantly worse in the morning.  He had an episode yesterday where he had some numbness/tingling in his right face and felt like he had to smile very hard in order to smile.  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  He states he has never had a CT scan of his head however.  He also tells me that he has had headaches since he was a kid however he feels that they have significantly worsened and become more frequent over the past couple years.  He denies any numbness weakness slurred speech confusion chest pain difficulty breathing lightheadedness or dizziness.  Denies any recent head injuries.  He states that he took some ibuprofen last night which improved his headache.  He states currently his headache is 3/10 and bitemporal.  No vision changes blurry vision double vision.    Home Medications Prior to Admission medications   Medication Sig Start Date End Date Taking? Authorizing Provider  metoCLOPramide (REGLAN) 10 MG tablet Take 1 tablet (10 mg total) by mouth every 6 (six) hours. 03/19/22  Yes Gailen Shelter, PA      Allergies    Vicodin [hydrocodone-acetaminophen]    Review of Systems   Review of Systems  Neurological:  Positive for headaches.    Physical Exam Updated Vital Signs BP 132/88   Pulse 67   Temp 98.7 F (37.1 C) (Oral)   Resp 16   Ht 6\' 2"  (1.88 m)   Wt 81.6 kg   SpO2 96%   BMI 23.11 kg/m  Physical  Exam Vitals and nursing note reviewed.  Constitutional:      General: He is not in acute distress. HENT:     Head: Normocephalic and atraumatic.     Nose: Nose normal.  Eyes:     General: No scleral icterus. Cardiovascular:     Rate and Rhythm: Normal rate and regular rhythm.     Pulses: Normal pulses.     Heart sounds: Normal heart sounds.  Pulmonary:     Effort: Pulmonary effort is normal. No respiratory distress.     Breath sounds: No wheezing.  Abdominal:     Palpations: Abdomen is soft.     Tenderness: There is no abdominal tenderness.  Musculoskeletal:     Cervical back: Normal range of motion.     Right lower leg: No edema.     Left lower leg: No edema.  Skin:    General: Skin is warm and dry.     Capillary Refill: Capillary refill takes less than 2 seconds.  Neurological:     Mental Status: He is alert. Mental status is at baseline.     Comments: Alert and oriented to self, place, time and event.   Speech is fluent, clear without dysarthria or dysphasia.   Strength 5/5 in upper/lower extremities   Sensation intact in upper/lower extremities   Normal gait.  Negative Romberg. No pronator drift.  Normal finger-to-nose and feet tapping.  CN I not tested  CN II grossly intact visual fields bilaterally. Did not visualize posterior eye.  CN III, IV, VI PERRLA and EOMs intact bilaterally  CN V Intact sensation to sharp and light touch to the face  CN VII facial movements symmetric  CN VIII not tested  CN IX, X no uvula deviation, symmetric rise of soft palate  CN XI 5/5 SCM and trapezius strength bilaterally  CN XII Midline tongue protrusion, symmetric L/R movements   Psychiatric:        Mood and Affect: Mood normal.        Behavior: Behavior normal.     ED Results / Procedures / Treatments   Labs (all labs ordered are listed, but only abnormal results are displayed) Labs Reviewed - No data to display  EKG None  Radiology No results  found.  Procedures Procedures    Medications Ordered in ED Medications  metoCLOPramide (REGLAN) tablet 10 mg (10 mg Oral Given 03/19/22 1317)    ED Course/ Medical Decision Making/ A&P                           Medical Decision Making Amount and/or Complexity of Data Reviewed Radiology: ordered.  Risk Prescription drug management.   This patient presents to the ED for concern of progressively worsening headaches, this involves a number of treatment options, and is a complaint that carries with it a moderate to high risk of complications and morbidity. A differential diagnosis was considered for the patient's symptoms which is discussed below:   Intra-cranial neoplasm less likely but given positional changes and headache and increased frequency will obtain CT head. Could be stress related, dehydration although he does seem that he drinks water pretty regularly.   Co morbidities: Discussed in HPI   Brief History:  Patient is a 26 year old male who denies any pertinent past medical history denies any recreational drug or any alcohol use  He is presented emergency room today with complaints of headache for the past 3 days most of the day but states that it is significantly worse in the morning.  He had an episode yesterday where he had some numbness/tingling in his right face and felt like he had to smile very hard in order to smile.  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  He states he has never had a CT scan of his head however.  He also tells me that he has had headaches since he was a kid however he feels that they have significantly worsened and become more frequent over the past couple years.  He denies any numbness weakness slurred speech confusion chest pain difficulty breathing lightheadedness or dizziness.  Denies any recent head injuries.  He states that he took some ibuprofen last night  which improved his headache.  He states currently his headache is 3/10 and bitemporal.  No vision changes blurry vision double vision.    EMR reviewed including pt PMHx, past surgical history and past visits to ER.   See HPI for more details   Lab Tests:      Imaging Studies:  NAD. I personally reviewed all imaging studies and no acute abnormality found. I agree with radiology interpretation.  CT head - NML  Cardiac Monitoring:  NA NA   Medicines ordered:  I ordered  medication including Reglan for headache and nausea Reevaluation of the patient after these medicines showed that the patient resolved I have reviewed the patients home medicines and have made adjustments as needed   Critical Interventions:     Consults/Attending Physician      Reevaluation:  After the interventions noted above I re-evaluated patient and found that they have : Had resolution of the symptoms   Social Determinants of Health:      Problem List / ED Course:  Headache now resolved.  CT head unremarkable.  Will recommend follow-up with headache specialist given that he has had headaches for years.   Dispostion:  After consideration of the diagnostic results and the patients response to treatment, I feel that the patent would benefit from follow-up with headache specialist.  Given information for Dr. Tomi Likens. Reglan for headache/nausea PRN.     Final Clinical Impression(s) / ED Diagnoses Final diagnoses:  Acute nonintractable headache, unspecified headache type    Rx / DC Orders ED Discharge Orders          Ordered    metoCLOPramide (REGLAN) 10 MG tablet  Every 6 hours        03/19/22 1317              Pati Gallo [place], [place] 03/19/22 1429    Blanchie Dessert, MD 03/19/22 1442

## 2022-03-19 NOTE — Discharge Instructions (Signed)
Your CT scan is without any abnormal findings.  Please follow-up with the headache specialist.  Use reglan for headaches up to once every 6 hours but try to use only once per day.   Hydrate, avoid caffeine.

## 2022-03-19 NOTE — ED Triage Notes (Signed)
Recurrent headache x 3 days , tingling to whole body , facial numbness 2 days ago . Alert and oriented x 4 ambulatory to triage .  Vomited once last night , no light sensitivity . No Hx migraine . Denies URI symptoms

## 2023-01-22 ENCOUNTER — Ambulatory Visit: Payer: Self-pay | Admitting: Family Medicine

## 2023-01-22 ENCOUNTER — Encounter: Payer: Self-pay | Admitting: Family Medicine

## 2023-01-22 ENCOUNTER — Telehealth: Payer: Self-pay | Admitting: Family Medicine

## 2023-01-22 NOTE — Telephone Encounter (Signed)
Call patient to reschedule missed appointment. °

## 2023-06-11 ENCOUNTER — Emergency Department (HOSPITAL_BASED_OUTPATIENT_CLINIC_OR_DEPARTMENT_OTHER): Payer: Self-pay

## 2023-06-11 ENCOUNTER — Encounter (HOSPITAL_BASED_OUTPATIENT_CLINIC_OR_DEPARTMENT_OTHER): Payer: Self-pay | Admitting: Emergency Medicine

## 2023-06-11 ENCOUNTER — Other Ambulatory Visit: Payer: Self-pay

## 2023-06-11 ENCOUNTER — Emergency Department (HOSPITAL_BASED_OUTPATIENT_CLINIC_OR_DEPARTMENT_OTHER)
Admission: EM | Admit: 2023-06-11 | Discharge: 2023-06-11 | Disposition: A | Discharge status: Home / Self Care | Payer: Self-pay | Attending: Emergency Medicine | Admitting: Emergency Medicine

## 2023-06-11 ENCOUNTER — Ambulatory Visit
Admission: EM | Admit: 2023-06-11 | Discharge: 2023-06-11 | Disposition: A | Discharge status: Home / Self Care | Payer: Self-pay

## 2023-06-11 DIAGNOSIS — R109 Unspecified abdominal pain: Secondary | ICD-10-CM

## 2023-06-11 DIAGNOSIS — N132 Hydronephrosis with renal and ureteral calculous obstruction: Secondary | ICD-10-CM | POA: Insufficient documentation

## 2023-06-11 DIAGNOSIS — N201 Calculus of ureter: Secondary | ICD-10-CM

## 2023-06-11 LAB — CBC WITH DIFFERENTIAL/PLATELET
Abs Immature Granulocytes: 0.01 10*3/uL (ref 0.00–0.07)
Basophils Absolute: 0 10*3/uL (ref 0.0–0.1)
Basophils Relative: 1 %
Eosinophils Absolute: 0 10*3/uL (ref 0.0–0.5)
Eosinophils Relative: 1 %
HCT: 41.9 % (ref 39.0–52.0)
Hemoglobin: 14.5 g/dL (ref 13.0–17.0)
Immature Granulocytes: 0 %
Lymphocytes Relative: 34 %
Lymphs Abs: 1.4 10*3/uL (ref 0.7–4.0)
MCH: 29.4 pg (ref 26.0–34.0)
MCHC: 34.6 g/dL (ref 30.0–36.0)
MCV: 85 fL (ref 80.0–100.0)
Monocytes Absolute: 0.4 10*3/uL (ref 0.1–1.0)
Monocytes Relative: 10 %
Neutro Abs: 2.3 10*3/uL (ref 1.7–7.7)
Neutrophils Relative %: 54 %
Platelets: 296 10*3/uL (ref 150–400)
RBC: 4.93 MIL/uL (ref 4.22–5.81)
RDW: 12.2 % (ref 11.5–15.5)
WBC: 4.3 10*3/uL (ref 4.0–10.5)
nRBC: 0 % (ref 0.0–0.2)

## 2023-06-11 LAB — URINALYSIS, ROUTINE W REFLEX MICROSCOPIC
Bacteria, UA: NONE SEEN
Bilirubin Urine: NEGATIVE
Glucose, UA: NEGATIVE mg/dL
Ketones, ur: NEGATIVE mg/dL
Leukocytes,Ua: NEGATIVE
Nitrite: NEGATIVE
Specific Gravity, Urine: 1.023 (ref 1.005–1.030)
pH: 8.5 — ABNORMAL HIGH (ref 5.0–8.0)

## 2023-06-11 LAB — COMPREHENSIVE METABOLIC PANEL
ALT: 10 U/L (ref 0–44)
AST: 13 U/L — ABNORMAL LOW (ref 15–41)
Albumin: 5 g/dL (ref 3.5–5.0)
Alkaline Phosphatase: 42 U/L (ref 38–126)
Anion gap: 10 (ref 5–15)
BUN: 19 mg/dL (ref 6–20)
CO2: 28 mmol/L (ref 22–32)
Calcium: 9.6 mg/dL (ref 8.9–10.3)
Chloride: 103 mmol/L (ref 98–111)
Creatinine, Ser: 1.56 mg/dL — ABNORMAL HIGH (ref 0.61–1.24)
GFR, Estimated: >60 mL/min (ref >60)
Glucose, Bld: 97 mg/dL (ref 70–99)
Potassium: 3.5 mmol/L (ref 3.5–5.1)
Sodium: 141 mmol/L (ref 135–145)
Total Bilirubin: 1.3 mg/dL — ABNORMAL HIGH (ref 0.0–1.2)
Total Protein: 7.4 g/dL (ref 6.5–8.1)

## 2023-06-11 LAB — LIPASE, BLOOD: Lipase: 17 U/L (ref 11–51)

## 2023-06-11 MED ORDER — ONDANSETRON HCL 4 MG/2ML IJ SOLN
4.0000 mg | Freq: Once | INTRAMUSCULAR | Status: AC
  Administered 2023-06-11: 4 mg via INTRAVENOUS
  Filled 2023-06-11: qty 2

## 2023-06-11 MED ORDER — SODIUM CHLORIDE 0.9 % IV BOLUS
1000.0000 mL | Freq: Once | INTRAVENOUS | Status: AC
  Administered 2023-06-11: 1000 mL via INTRAVENOUS

## 2023-06-11 MED ORDER — MORPHINE SULFATE (PF) 4 MG/ML IV SOLN
4.0000 mg | Freq: Once | INTRAVENOUS | Status: AC
  Administered 2023-06-11: 4 mg via INTRAVENOUS
  Filled 2023-06-11: qty 1

## 2023-06-11 MED ORDER — OXYCODONE-ACETAMINOPHEN 5-325 MG PO TABS: 1.0000 | ORAL_TABLET | Freq: Four times a day (QID) | ORAL | 0 refills | Status: AC | PRN

## 2023-06-11 MED ORDER — LORAZEPAM 1 MG PO TABS
1.0000 mg | ORAL_TABLET | Freq: Once | ORAL | Status: AC
  Administered 2023-06-11: 1 mg via ORAL
  Filled 2023-06-11: qty 1

## 2023-06-11 MED ORDER — IOHEXOL 300 MG/ML  SOLN
100.0000 mL | Freq: Once | INTRAMUSCULAR | Status: AC | PRN
  Administered 2023-06-11: 80 mL via INTRAVENOUS

## 2023-06-11 MED ORDER — ONDANSETRON 4 MG PO TBDP: 4.0000 mg | ORAL_TABLET | Freq: Three times a day (TID) | ORAL | 0 refills | Status: AC | PRN

## 2023-06-11 MED ORDER — OXYCODONE-ACETAMINOPHEN 5-325 MG PO TABS: 1.0000 | ORAL_TABLET | Freq: Once | ORAL | Status: DC

## 2023-06-11 MED ORDER — KETOROLAC TROMETHAMINE 15 MG/ML IJ SOLN
15.0000 mg | Freq: Once | INTRAMUSCULAR | Status: AC
  Administered 2023-06-11: 15 mg via INTRAVENOUS
  Filled 2023-06-11: qty 1

## 2023-06-11 MED ORDER — CYCLOBENZAPRINE HCL 5 MG PO TABS: 5.0000 mg | ORAL_TABLET | Freq: Three times a day (TID) | ORAL | 0 refills | Status: AC | PRN

## 2023-06-11 MED ORDER — HYDROMORPHONE HCL 1 MG/ML IJ SOLN
1.0000 mg | Freq: Once | INTRAMUSCULAR | Status: AC
  Administered 2023-06-11: 1 mg via INTRAVENOUS
  Filled 2023-06-11: qty 1

## 2023-06-11 NOTE — Discharge Instructions (Addendum)
Please follow-up with urology, your stone will likely pass, in the next few days.  I have sent some pain medications, to help with the pain.  I have also sent some muscle relaxers, to help with the spasms.  Please strain your urine, as instructed.

## 2023-06-11 NOTE — ED Triage Notes (Signed)
Pt referred by UC, c/o acute LLQ pain with n/v today

## 2023-06-11 NOTE — ED Provider Notes (Addendum)
Patient presented to urgent care with a complaint of severe left-sided abdominal pain with 1 episode of nausea.  He reported symptoms started abruptly around 1300 while he was at work at [employer].  Patient came directly here for evaluation.  Patient describes pain as 10 out of 10.  This Clinical research associate evaluated him in triage and advised that I could call EMS or he could call someone to pick him up.  Patient attempted to reach out to his mom or friend to pick him up wherever he reported that he did not want EMS called he felt that pain was tolerable enough to drive himself to the emergency department.  Given complaint was abdominal pain nothing was given here for pain as patient advised that he would likely need further diagnostic workup.  Patient asked that he would go to [facility] for workup and evaluation.   Bing Neighbors, NP 06/11/23 1350    Bing Neighbors, NP 06/11/23 1350

## 2023-06-11 NOTE — ED Triage Notes (Signed)
"  This started at work with abd/side pain (left) and with vomiting (last time: 1300). No dysuria. Last void (1300, no blood, not discolored). Stools "normal" (last was around 1300 normal just now when going), yesterday loose.

## 2023-06-11 NOTE — ED Notes (Signed)
Patient is being discharged from the Urgent Care and sent to the Emergency Department via personal vehicle . Per Provider Joaquin Courts, NP), patient is in need of higher level of care due to severe abdominal pain. Patient is aware and verbalizes understanding of plan of care.  Vitals:   06/11/23 1337  BP: 128/83  Pulse: 70  Resp: 18  Temp: 98.2 F (36.8 C)  SpO2: 99%

## 2023-06-11 NOTE — ED Provider Notes (Signed)
[place] EMERGENCY DEPARTMENT AT [facility] Provider Note   CSN: [id number] Arrival date & time: 06/11/23  1400     History  Chief Complaint  Patient presents with   Abdominal Pain    Christian Grant is a 28 y.o. male, no pertinent past medical history, who presents to the ED secondary to left lower quadrant pain, that started today.  He has had associated nausea, but no vomiting.  Did have 1 episode of diarrhea yesterday, but denies any urinary symptoms including increased frequency, urgency or burning.  He states the pain has been constant for the last hour, and is sharp in nature.  Has not taken anything for the pain.  Went to urgent care and was sent here.  No history of any abdominal surgeries.  Home Medications Prior to Admission medications   Medication Sig Start Date End Date Taking? Authorizing Provider  cyclobenzaprine (FLEXERIL) 5 MG tablet Take 1 tablet (5 mg total) by mouth 3 (three) times daily as needed. 06/11/23  Yes Ollie Delano L, PA  ondansetron (ZOFRAN-ODT) 4 MG disintegrating tablet Take 1 tablet (4 mg total) by mouth every 8 (eight) hours as needed. 06/11/23  Yes Dominika Losey L, PA  oxyCODONE-acetaminophen (PERCOCET/ROXICET) 5-325 MG tablet Take 1 tablet by mouth every 6 (six) hours as needed for severe pain (pain score 7-10). 06/11/23  Yes Analisa Sledd L, PA  metoCLOPramide (REGLAN) 10 MG tablet Take 1 tablet (10 mg total) by mouth every 6 (six) hours. 03/19/22   Gailen Shelter, PA      Allergies    Hydrocodone-acetaminophen and Vicodin [hydrocodone-acetaminophen]    Review of Systems   Review of Systems  Gastrointestinal:  Positive for abdominal pain. Negative for nausea and vomiting.    Physical Exam Updated Vital Signs BP 135/74   Pulse (!) 57   Temp 97.6 F (36.4 C)   Resp 20   Wt 81.6 kg   SpO2 100%   BMI 23.11 kg/m  Physical Exam Vitals and nursing note reviewed.  Constitutional:      General: He is not in acute distress.     Appearance: He is well-developed.  HENT:     Head: Normocephalic and atraumatic.  Eyes:     Conjunctiva/sclera: Conjunctivae normal.  Cardiovascular:     Rate and Rhythm: Normal rate and regular rhythm.     Heart sounds: No murmur heard. Pulmonary:     Effort: Pulmonary effort is normal. No respiratory distress.     Breath sounds: Normal breath sounds.  Abdominal:     Palpations: Abdomen is soft.     Tenderness: There is abdominal tenderness in the left upper quadrant and left lower quadrant.  Musculoskeletal:        General: No swelling.     Cervical back: Neck supple.  Skin:    General: Skin is warm and dry.     Capillary Refill: Capillary refill takes less than 2 seconds.  Neurological:     Mental Status: He is alert.  Psychiatric:        Mood and Affect: Mood normal.     ED Results / Procedures / Treatments   Labs (all labs ordered are listed, but only abnormal results are displayed) Labs Reviewed  COMPREHENSIVE METABOLIC PANEL - Abnormal; Notable for the following components:      Result Value   Creatinine, Ser 1.56 (*)    AST 13 (*)    Total Bilirubin 1.3 (*)    All other components within  normal limits  URINALYSIS, ROUTINE W REFLEX MICROSCOPIC - Abnormal; Notable for the following components:   pH 8.5 (*)    Hgb urine dipstick TRACE (*)    Protein, ur TRACE (*)    All other components within normal limits  CBC WITH DIFFERENTIAL/PLATELET  LIPASE, BLOOD    EKG None  Radiology CT ABDOMEN PELVIS W CONTRAST Result Date: 06/11/2023 CLINICAL DATA:  Left lower quadrant pain today. Referred by urgent care. Nausea and vomiting today. EXAM: CT ABDOMEN AND PELVIS WITH CONTRAST TECHNIQUE: Multidetector CT imaging of the abdomen and pelvis was performed using the standard protocol following bolus administration of intravenous contrast. RADIATION DOSE REDUCTION: This exam was performed according to the departmental dose-optimization program which includes automated exposure  control, adjustment of the mA and/or kV according to patient size and/or use of iterative reconstruction technique. CONTRAST:  80mL OMNIPAQUE IOHEXOL 300 MG/ML  SOLN COMPARISON:  AP pelvis 02/12/2015 FINDINGS: Lower chest: Lung bases are unremarkable. Hepatobiliary: Smooth liver contours. Mild symmetric low-density around the portal veins, likely mild periportal edema. No focal liver lesion is seen. The gallbladder 1 is unremarkable. Pancreas: Unremarkable. No pancreatic ductal dilatation or surrounding inflammatory changes. Spleen: Normal in size without focal abnormality. Adrenals/Urinary Tract: Normal bilateral adrenals. There are 2 mm right midpole (axial series 2, image 21) and 2 mm right lower pole (axial series 2, image 26) nonobstructing right-sided renal stones. There is a 2-3 mm calcific density within the left hemipelvis that appears to be a distal left ureteral obstructing stone approximately 4 mm proximal to the left ureterovesicular junction (axial series 2, image 71 and coronal series 5, image 33). There is mild upstream left ureterectasis and hydronephrosis. There is a moderately delayed left nephrogram compared to the right kidney. The urinary bladder is decompressed, limiting evaluation. Stomach/Bowel: No dilated loops of bowel to indicate bowel obstruction. Moderate stool ball within the proximal rectum (axial image 69 and sagittal image 59). No bowel wall thickening is seen. Minimal mesenteric fat limits evaluation for the appendix, however there does appear to be an air-filled, a normal caliber blind-ending tubular structure representing the normal appendix on coronal series 5 images 29 through 33 and axial series 2 images 59 through 62). Vascular/Lymphatic: No significant vascular findings are present. No enlarged abdominal or pelvic lymph nodes. Reproductive: The prostate and seminal vesicles are grossly unremarkable. Other: No ventral abdominal hernia. No free air or free fluid within the  abdomen or pelvis. Musculoskeletal: There is transitional lumbosacral anatomy. No acute skeletal abnormality. IMPRESSION: 1. There is a 2-3 yes mm obstructing stone within the distal left ureter approximately 4 mm proximal to the left ureterovesicular junction. There is mild upstream left ureterectasis and hydronephrosis. There is a moderately delayed left nephrogram compared to the right kidney likely related to urinary obstruction. 2. There are 2 nonobstructing right-sided renal stones measuring 2 mm. 3. Moderate stool ball within the proximal rectum. Electronically Signed   By: Neita Garnet M.D.   On: 06/11/2023 15:51    Procedures Procedures    Medications Ordered in ED Medications  LORazepam (ATIVAN) tablet 1 mg (has no administration in time range)  ketorolac (TORADOL) 15 MG/ML injection 15 mg (15 mg Intravenous Given 06/11/23 1436)  ondansetron (ZOFRAN) injection 4 mg (4 mg Intravenous Given 06/11/23 1436)  sodium chloride 0.9 % bolus 1,000 mL (0 mLs Intravenous Stopped 06/11/23 1609)  HYDROmorphone (DILAUDID) injection 1 mg (1 mg Intravenous Given 06/11/23 1454)  iohexol (OMNIPAQUE) 300 MG/ML solution 100 mL (80 mLs Intravenous  Contrast Given 06/11/23 1527)  morphine (PF) 4 MG/ML injection 4 mg (4 mg Intravenous Given 06/11/23 1610)    ED Course/ Medical Decision Making/ A&P                                 Medical Decision Making Patient is a 28 year old male, here for left lower quadrant pain, that it came on acutely today.  Reports some nausea, and some diarrhea, no vomiting however.  Will obtain a CT abdomen pelvis, given his left lower quadrant tenderness to palpation.  There is no radiation.  Will also give Toradol, Zofran for pain control  Amount and/or Complexity of Data Reviewed Labs: ordered.    Details: Creatinine 1.56 Radiology: ordered.    Details: CT abdomen pelvis shows a obstructing 2 mm stone proximal to the UVJ. Discussion of management or test interpretation with  external provider(s): Discussed findings with the patient, CT abd pelvis shows obstructing 2 mm stone, his creatinine is 1.56, however has no baseline, to compare.   His pain is controlled after 3 doses of pain medication, he looks much more comfortable.  He was discharged home Percocets, Zofran, and Flexeril for pain control, and instructed to strain his urine.  He voiced understanding, referred to urology for follow-up, as he has no PCP  Risk Prescription drug management.   Final Clinical Impression(s) / ED Diagnoses Final diagnoses:  Left ureteral stone    Rx / DC Orders ED Discharge Orders          Ordered    oxyCODONE-acetaminophen (PERCOCET/ROXICET) 5-325 MG tablet  Every 6 hours PRN        06/11/23 1612    ondansetron (ZOFRAN-ODT) 4 MG disintegrating tablet  Every 8 hours PRN        06/11/23 1612    cyclobenzaprine (FLEXERIL) 5 MG tablet  3 times daily PRN        06/11/23 1613              Solymar Grace Elbert Ewings, PA 06/11/23 1618    Virgina Norfolk, DO 06/14/23 [phone]
